# Patient Record
Sex: Female | Born: 1995 | Hispanic: No | Marital: Single
Health system: Southern US, Community
[De-identification: ages and names within clinical notes are randomized; demographics above are authoritative.]

---

## 2019-03-31 ENCOUNTER — Other Ambulatory Visit: Payer: Self-pay

## 2019-03-31 DIAGNOSIS — Z20822 Contact with and (suspected) exposure to covid-19: Secondary | ICD-10-CM

## 2019-04-02 LAB — NOVEL CORONAVIRUS, NAA: SARS-CoV-2, NAA: NOT DETECTED

## 2020-03-18 ENCOUNTER — Encounter (HOSPITAL_COMMUNITY): Payer: Self-pay

## 2020-03-18 ENCOUNTER — Emergency Department (HOSPITAL_COMMUNITY): Payer: BLUE CROSS/BLUE SHIELD

## 2020-03-18 ENCOUNTER — Emergency Department (HOSPITAL_COMMUNITY)
Admission: EM | Admit: 2020-03-18 | Discharge: 2020-03-18 | Disposition: A | Discharge status: Home / Self Care | Payer: BLUE CROSS/BLUE SHIELD | Attending: Emergency Medicine | Admitting: Emergency Medicine

## 2020-03-18 DIAGNOSIS — F1210 Cannabis abuse, uncomplicated: Secondary | ICD-10-CM | POA: Insufficient documentation

## 2020-03-18 DIAGNOSIS — F458 Other somatoform disorders: Secondary | ICD-10-CM | POA: Diagnosis not present

## 2020-03-18 DIAGNOSIS — R09A2 Foreign body sensation, throat: Secondary | ICD-10-CM

## 2020-03-18 DIAGNOSIS — F419 Anxiety disorder, unspecified: Secondary | ICD-10-CM | POA: Diagnosis not present

## 2020-03-18 DIAGNOSIS — R0989 Other specified symptoms and signs involving the circulatory and respiratory systems: Secondary | ICD-10-CM

## 2020-03-18 MED ORDER — HYDROXYZINE HCL 25 MG PO TABS: 25.0000 mg | ORAL_TABLET | Freq: Three times a day (TID) | ORAL | 0 refills | Status: AC | PRN

## 2020-03-18 MED ORDER — FAMOTIDINE 20 MG PO TABS: 20.0000 mg | ORAL_TABLET | Freq: Every day | ORAL | 0 refills | Status: AC

## 2020-03-18 NOTE — ED Notes (Signed)
ED Provider at bedside. 

## 2020-03-18 NOTE — ED Provider Notes (Signed)
[facility]-EMERGENCY DEPT Provider Note   CSN: [id number] Arrival date & time: 03/18/20  1424     History Chief Complaint  Patient presents with  . Anxiety    Mikayla Wells is a 24 y.o. female presenting for evaluation of multiple complaints.  Patient states over the past month and a half, she has had intermittent symptoms including the following: Feeling like something is stuck in her throat, feeling like her abdominal organs are shifting in her abdomen is no longer in line, feeling like her neck is pushing her trachea forward, feeling like her throat is closing, and having difficulty swallowing.  She states today her symptoms are mostly resolved, however last night she had a feeling like something was sharp in her throat and that it had broken off and was stuck there.  She states she was talking to her behavioral health counselor who recommended she come to the ER to be evaluated.  She reports no history of medical problems, reports a history of anxiety and ADHD.  She is not on antidepressants currently.  She denies fall, trauma, or injury.  No fevers, chills, cough, shortness of breath, chest pain, nausea, vomiting, abdominal pain, urinary symptoms, abnormal bowel movements.  HPI     No past medical history on file.  There are no problems to display for this patient.    OB History   No obstetric history on file.     History reviewed. No pertinent family history.  Social History   Tobacco Use  . Smoking status: Not on file  Substance Use Topics  . Alcohol use: Not on file  . Drug use: Yes    Types: Marijuana    Home Medications Prior to Admission medications   Medication Sig Start Date End Date Taking? Authorizing Provider  famotidine (PEPCID) 20 MG tablet Take 1 tablet (20 mg total) by mouth daily. 03/18/20   Augustin Bun, PA-C  hydrOXYzine (ATARAX/VISTARIL) 25 MG tablet Take 1 tablet (25 mg total) by mouth every 8 (eight) hours  as needed. 03/18/20   Milas Schappell, PA-C    Allergies    Patient has no known allergies.  Review of Systems   Review of Systems  HENT: Positive for trouble swallowing.        Feeling like something is stuck in her throat, feeling as if her throat is being pushed forward/out.   All other systems reviewed and are negative.   Physical Exam Updated Vital Signs BP 125/83 (BP Location: Left Arm)   Pulse 78   Temp 98.4 F (36.9 C) (Oral)   Resp 16   Ht 5' 6.5" (1.689 m)   Wt 83.9 kg   LMP 03/18/2020   SpO2 99%   BMI 29.41 kg/m   Physical Exam Vitals and nursing note reviewed.  Constitutional:      General: She is not in acute distress.    Appearance: She is well-developed.     Comments: Sitting in the bed in no acute distress  HENT:     Head: Normocephalic and atraumatic.     Comments: OP clear without tonsillar swelling or exudate.  Uvula midline with equal palate rise.  No muffled voice.  Handling secretions easily.  Airway grossly intact. Eyes:     Extraocular Movements: Extraocular movements intact.     Conjunctiva/sclera: Conjunctivae normal.     Pupils: Pupils are equal, round, and reactive to light.  Neck:     Comments: No clear throat/neck swelling.  No tenderness  palpation of the anterior neck.  No lymphadenopathy.  No obvious thyromegaly Cardiovascular:     Rate and Rhythm: Normal rate and regular rhythm.     Pulses: Normal pulses.  Pulmonary:     Effort: Pulmonary effort is normal. No respiratory distress.     Breath sounds: Normal breath sounds. No wheezing.     Comments: Clear lung sounds in all fields.  Speaking full sentences. Abdominal:     General: There is no distension.     Palpations: Abdomen is soft. There is no mass.     Tenderness: There is no abdominal tenderness. There is no guarding or rebound.  Musculoskeletal:        General: Normal range of motion.     Cervical back: Normal range of motion and neck supple.  Skin:    General: Skin is  warm and dry.     Capillary Refill: Capillary refill takes less than 2 seconds.  Neurological:     Mental Status: She is alert and oriented to person, place, and time.  Psychiatric:        Mood and Affect: Mood is anxious.     Comments: Appears anxious on my exam     ED Results / Procedures / Treatments   Labs (all labs ordered are listed, but only abnormal results are displayed) Labs Reviewed - No data to display  EKG None  Radiology DG Neck Soft Tissue  Result Date: 03/18/2020 CLINICAL DATA:  Anterior neck pain and swelling times several weeks. EXAM: NECK SOFT TISSUES - 1+ VIEW COMPARISON:  None. FINDINGS: There is no evidence of retropharyngeal soft tissue swelling or epiglottic enlargement. The cervical airway is unremarkable and no radio-opaque foreign body identified. IMPRESSION: Negative. Electronically Signed   By: Aram Candela M.D.   On: 03/18/2020 16:08   DG Chest 2 View  Result Date: 03/18/2020 CLINICAL DATA:  Anterior neck pain and swelling times several weeks. EXAM: CHEST - 2 VIEW COMPARISON:  None. FINDINGS: There is no evidence of acute infiltrate, pleural effusion or pneumothorax. The heart size and mediastinal contours are within normal limits. The visualized skeletal structures are unremarkable. IMPRESSION: No active cardiopulmonary disease. Electronically Signed   By: Aram Candela M.D.   On: 03/18/2020 16:00    Procedures Procedures (including critical care time)  Medications Ordered in ED Medications - No data to display  ED Course  I have reviewed the triage vital signs and the nursing notes.  Pertinent labs & imaging results that were available during my care of the patient were reviewed by me and considered in my medical decision making (see chart for details).    MDM Rules/Calculators/A&P                          Patient presenting for evaluation of multiple symptoms over the past month and a half.  Currently is symptom-free, however last  night she felt like something was sticking in her throat.  A lot of her symptoms are related to feel like her throat is closing, swollen, or having difficulty swallowing.  On my exam, she does appear anxious.  As such, consider gerd and/or anxiety.  Chest and neck x-ray obtained in triage read interpreted by me, no obvious abnormality or foreign body.  EKG shows sinus tach, on my exam patient is not tachycardic.  Airway is patent, symptoms have been present for several weeks.  I do not believe she needs emergent hospitalization or intervention.  Will start patient on Pepcid.  Vistaril as needed for anxiety.  Will have patient follow-up with psychiatry and primary care.  Patient requesting information for a "specialist."  Patient given information for ENT, however I discussed that at this time I do not believe she needs emergent ENT referral.  At this time, patient appears safe for discharge.  Return precautions given.  Patient states she understands and agrees to plan.   Final Clinical Impression(s) / ED Diagnoses Final diagnoses:  Anxiety  Globus sensation    Rx / DC Orders ED Discharge Orders         Ordered    hydrOXYzine (ATARAX/VISTARIL) 25 MG tablet  Every 8 hours PRN        03/18/20 2035    famotidine (PEPCID) 20 MG tablet  Daily        03/18/20 2035           Alveria Apley, PA-C 03/18/20 2145    Terrilee Files, MD 03/19/20 1048

## 2020-03-18 NOTE — ED Triage Notes (Signed)
Pt reports a tight, burning feeling around her neck since late July. While sitting in the car, she felt all of her insides "shift around." She feels like air is pocketed in her throat. Pt is very anxious appearing and cannot pinpoint any specific injury or incident.

## 2020-03-18 NOTE — Discharge Instructions (Signed)
Take famotidine daily to try and decrease stomach acid. Use Vistaril up to 3 times a day as needed for anxiety. Follow-up with a psychiatrist/your counselor as needed for further management of your anxiety. I recommend you follow-up with your primary care doctor.  Call the [phone] in your paperwork to set up an appointment. If you continue to have feelings like your throat is closing or you are having difficulty swallowing after taking the medicine daily for 1-2 weeks, you may follow-up with the ENT doctor listed below. Return to the emergency room with any new worsening, or concerning symptoms.

## 2021-05-31 IMAGING — CR DG CHEST 2V
2 series · 2 of 2 positions shown · non-contrast
Comparison: None.

CLINICAL DATA: Anterior neck pain and swelling times several weeks.

EXAM:
CHEST - 2 VIEW

[w chest pa]
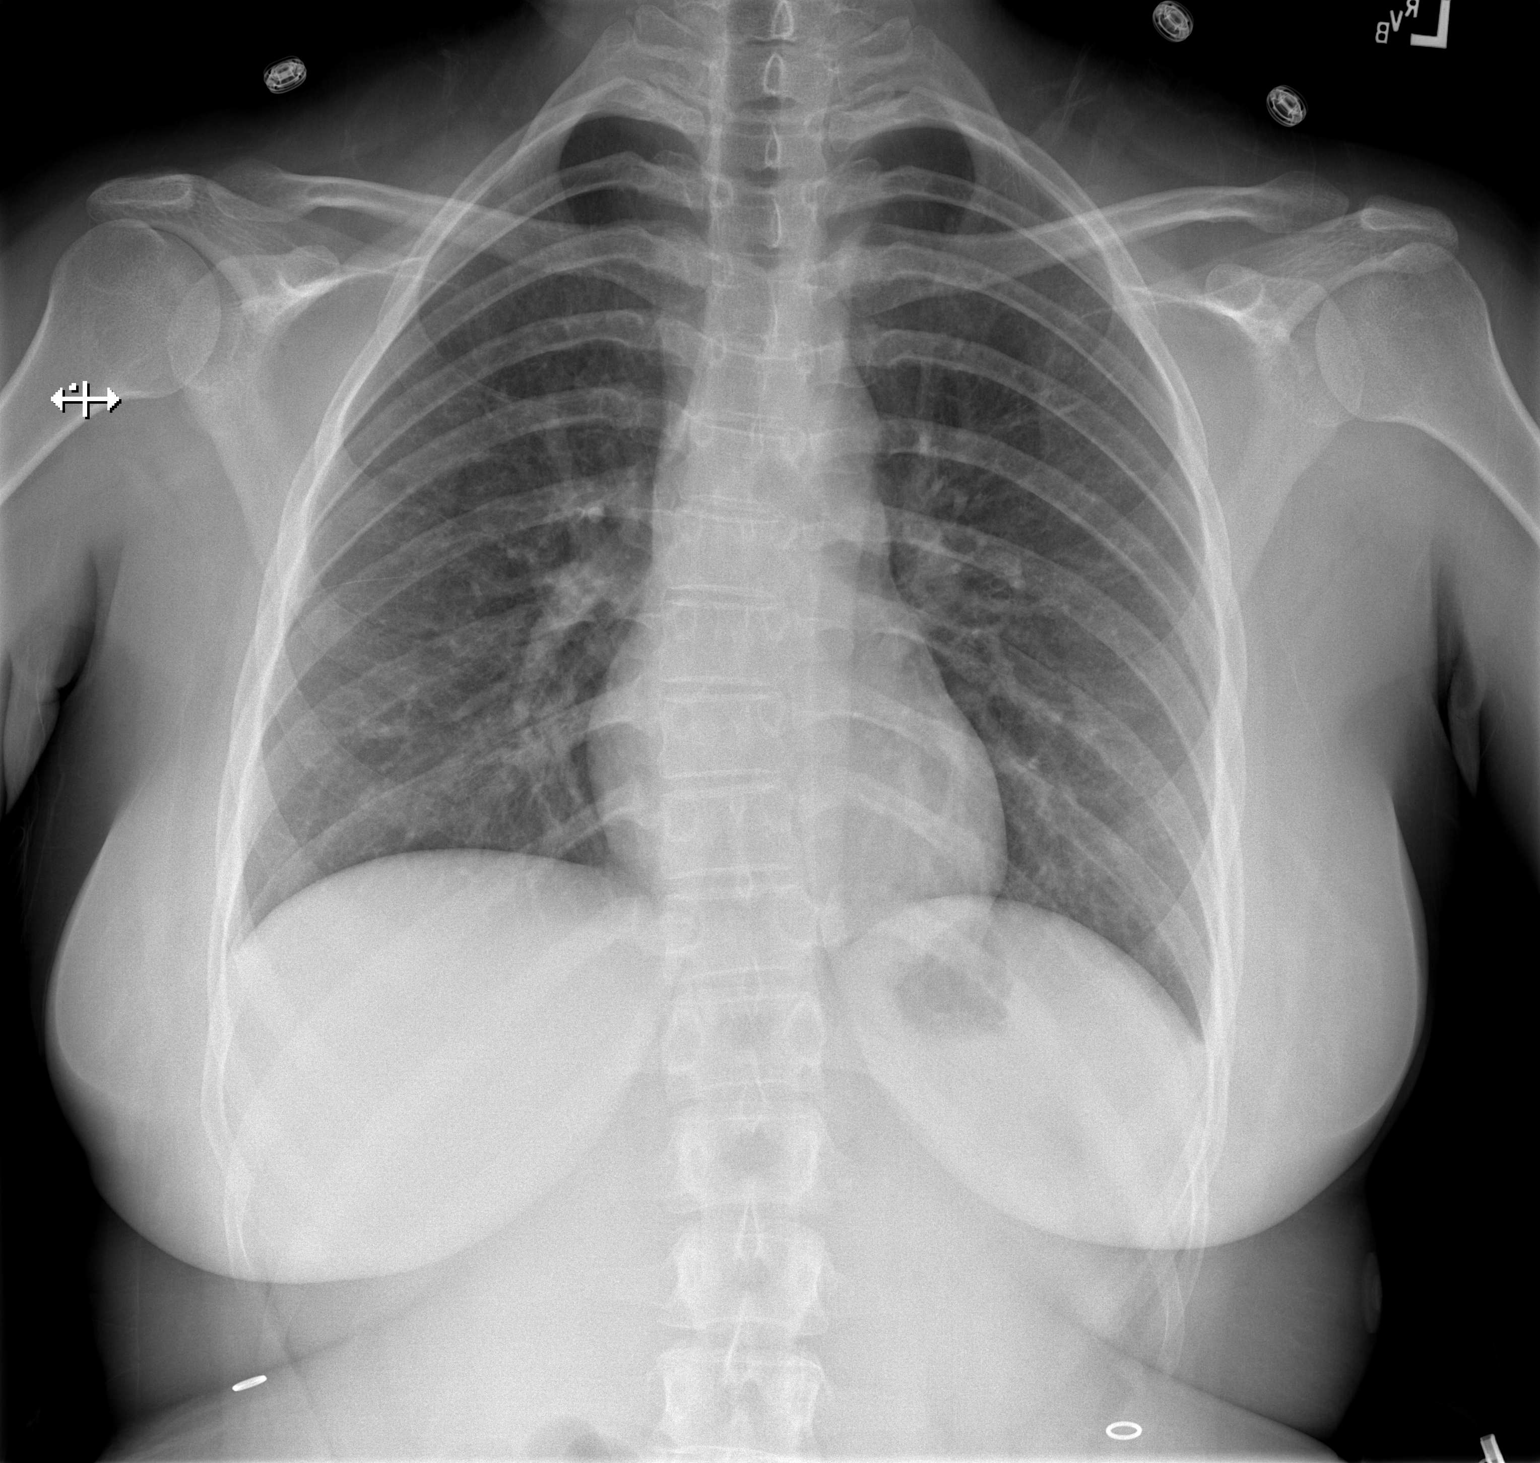

[w chest lat]
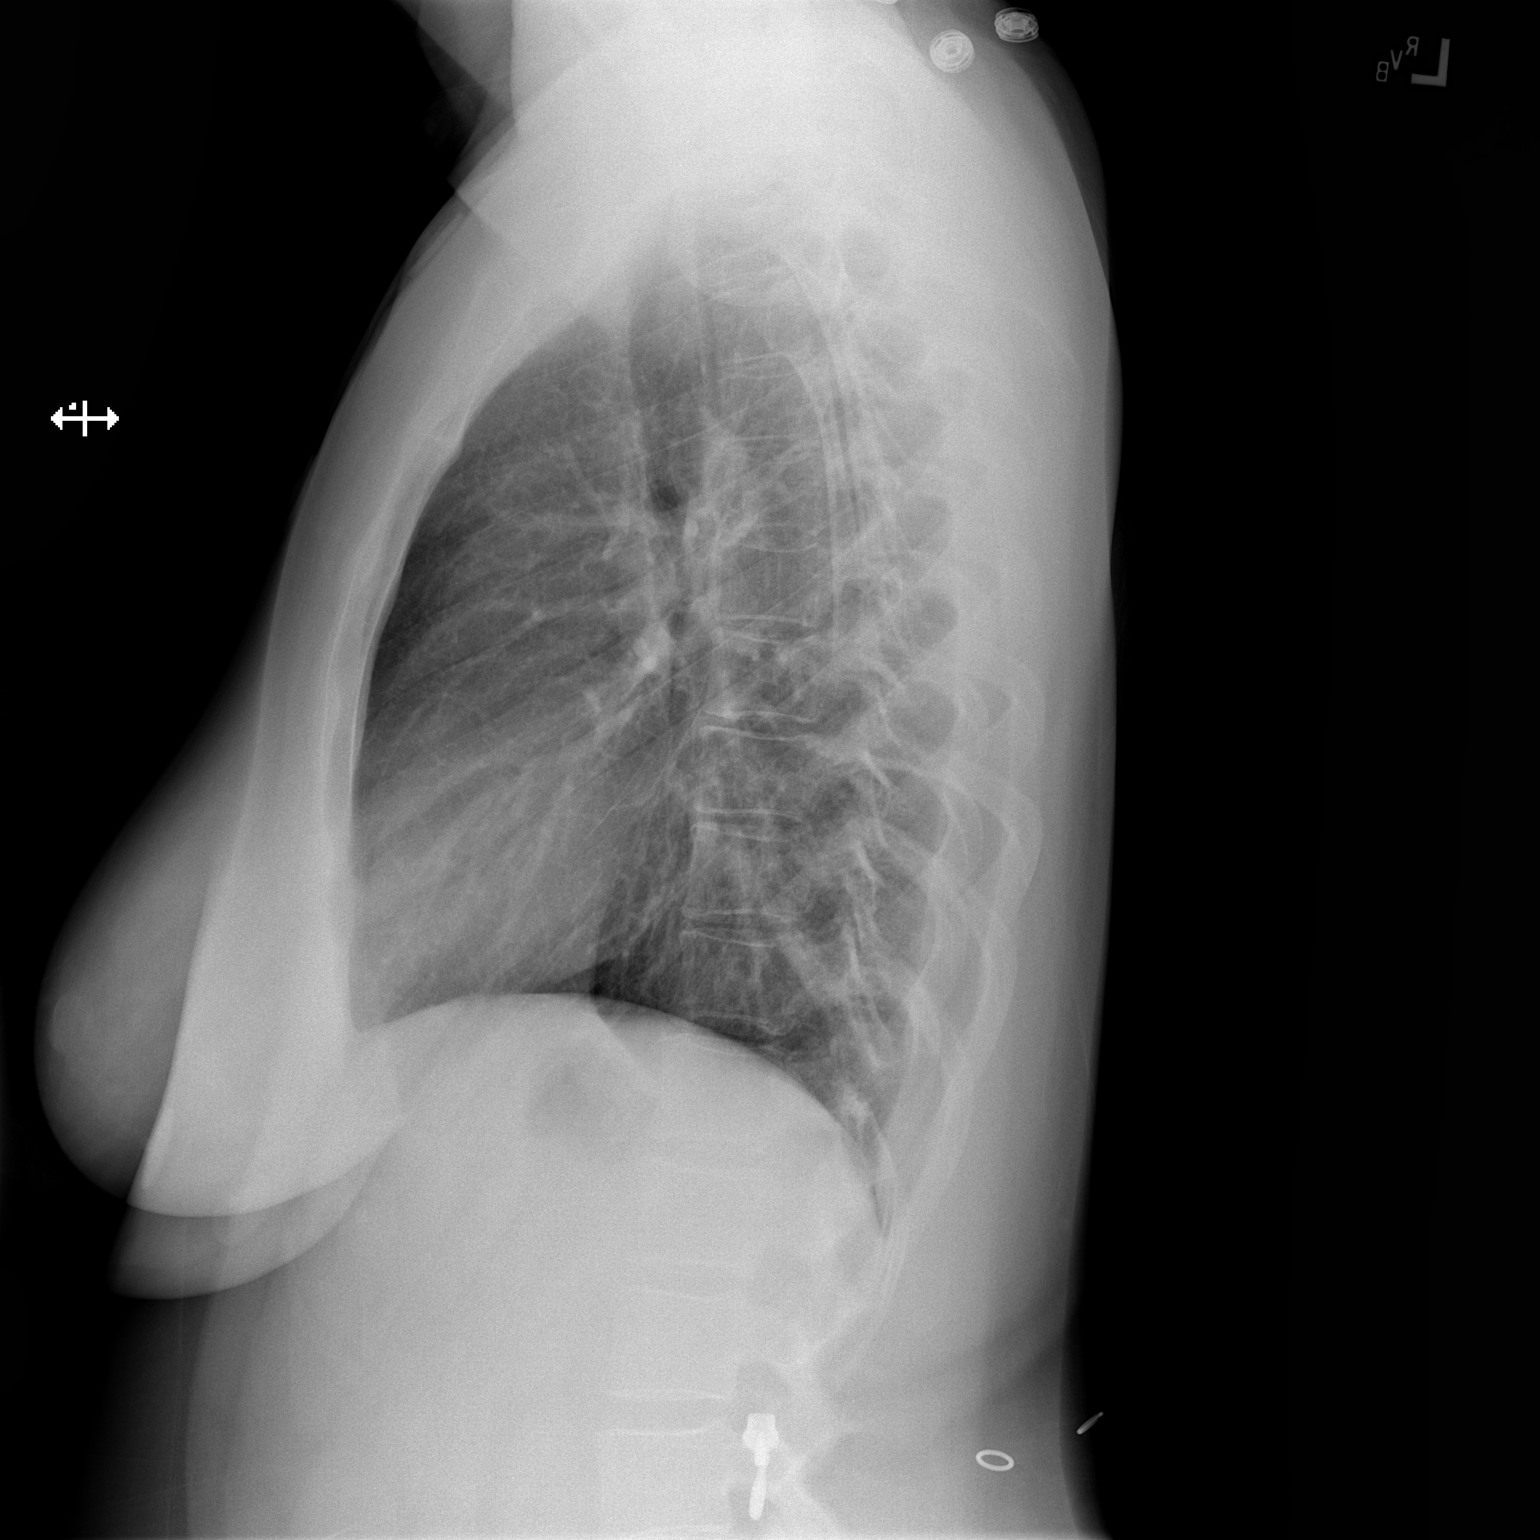

[2 of 2 positions shown; findings below may reference images not displayed]

FINDINGS: There is no evidence of acute infiltrate, pleural effusion or
pneumothorax. The heart size and mediastinal contours are within
normal limits. The visualized skeletal structures are unremarkable.
IMPRESSION: No active cardiopulmonary disease.

## 2021-05-31 IMAGING — CR DG NECK SOFT TISSUE
2 series · 2 of 2 positions shown · non-contrast
Comparison: None.

CLINICAL DATA: Anterior neck pain and swelling times several weeks.

EXAM:
NECK SOFT TISSUES - 1+ VIEW

[w soft tissue neck ap]
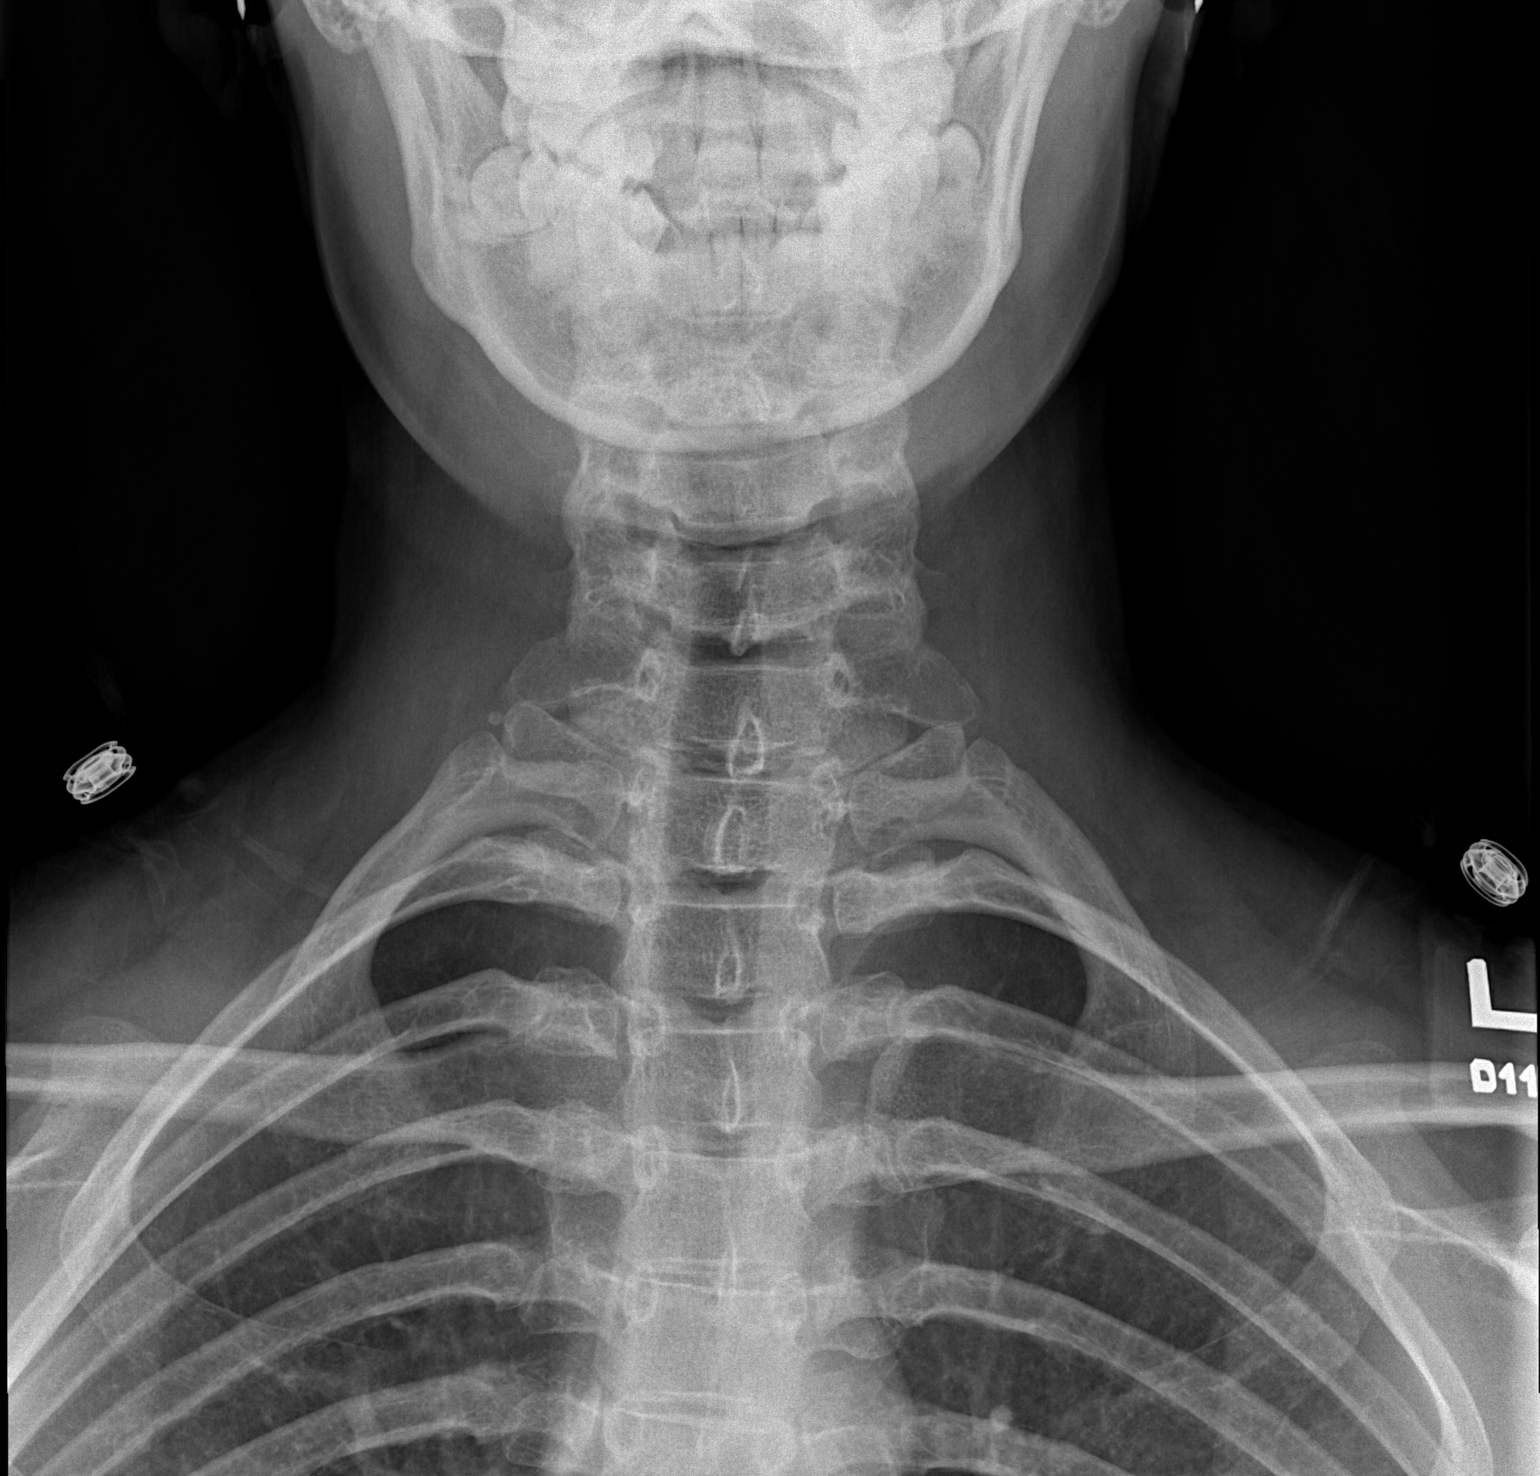

[w soft tissue neck lat]
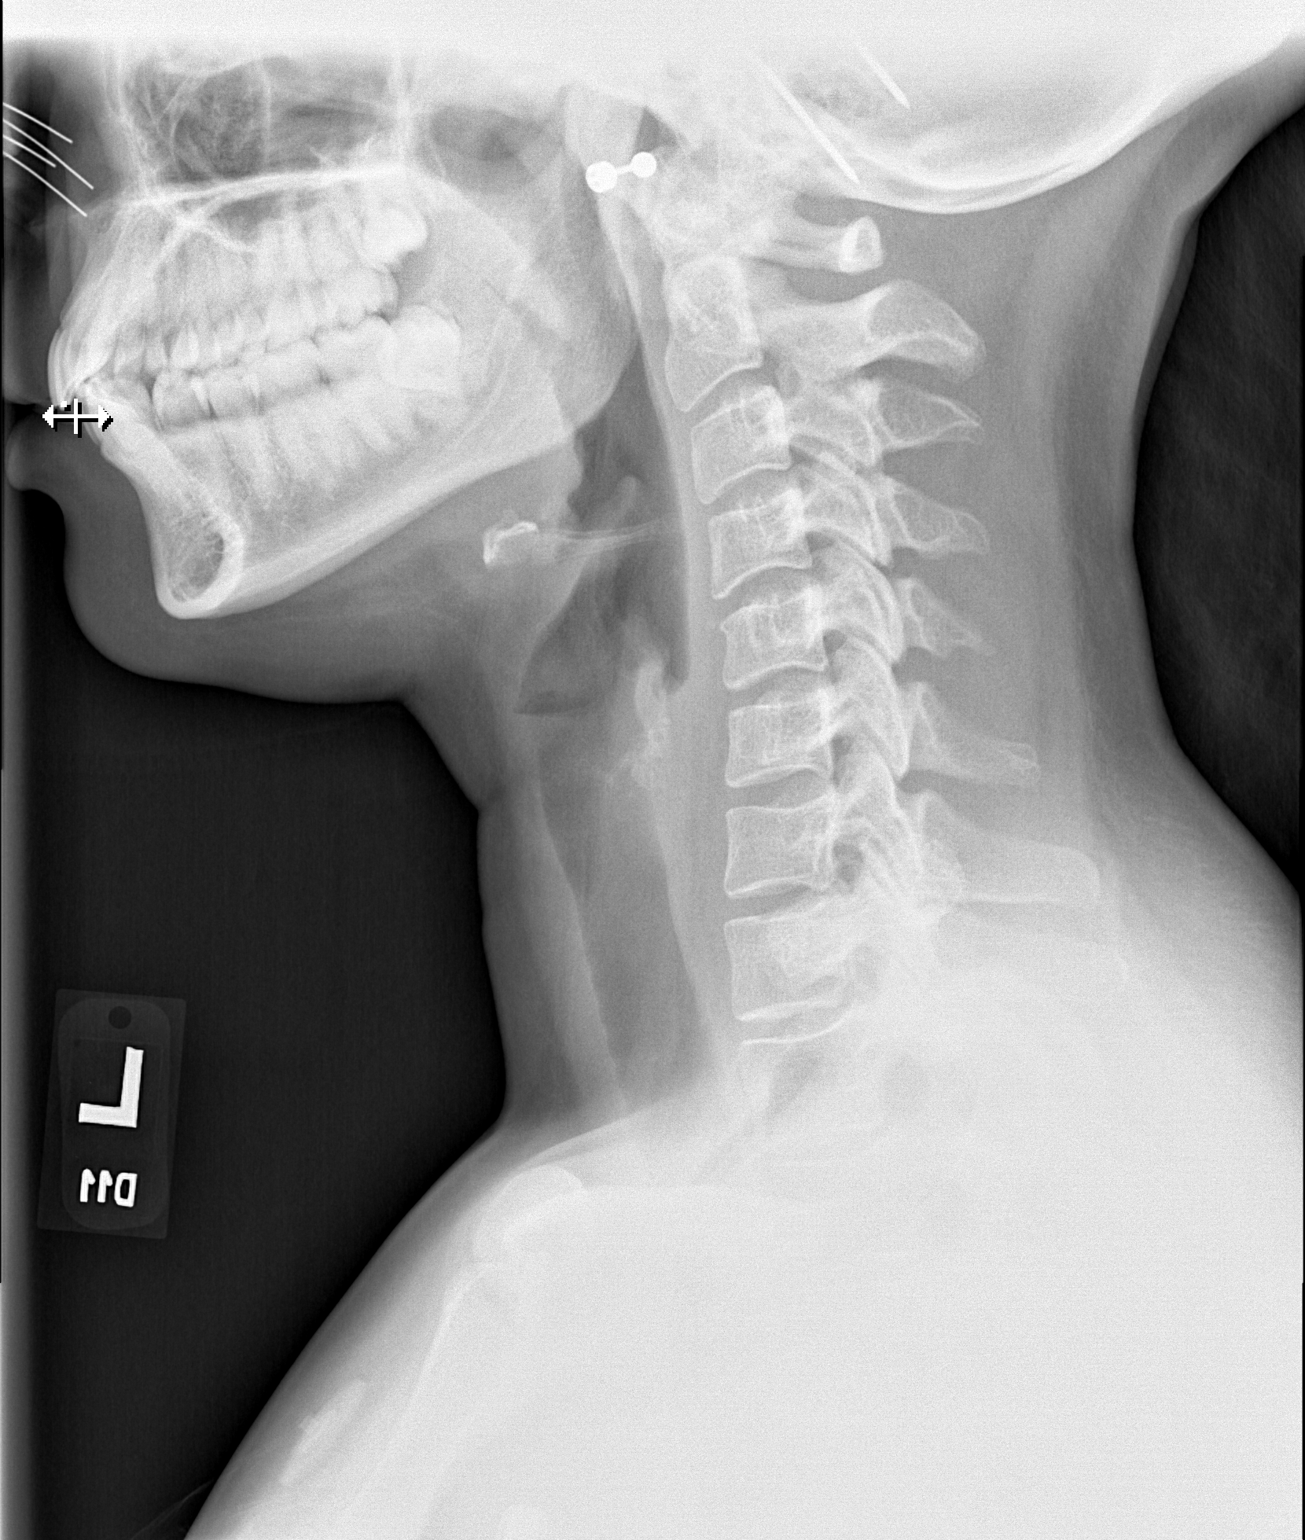

[2 of 2 positions shown; findings below may reference images not displayed]

FINDINGS: There is no evidence of retropharyngeal soft tissue swelling or
epiglottic enlargement. The cervical airway is unremarkable and no
radio-opaque foreign body identified.
IMPRESSION: Negative.
